# Patient Record
Sex: Female | Born: 1960 | Race: Black or African American | Hispanic: No | Marital: Single | Smoking: Current every day smoker
Health system: Southern US, Community
[De-identification: ages and names within clinical notes are randomized; demographics above are authoritative.]

## PROBLEM LIST (undated history)

## (undated) DIAGNOSIS — K589 Irritable bowel syndrome without diarrhea: Secondary | ICD-10-CM

## (undated) DIAGNOSIS — I10 Essential (primary) hypertension: Secondary | ICD-10-CM

---

## 2003-10-15 ENCOUNTER — Emergency Department (HOSPITAL_COMMUNITY): Admission: EM | Admit: 2003-10-15 | Discharge: 2003-10-15 | Payer: Self-pay | Admitting: Emergency Medicine

## 2004-03-07 ENCOUNTER — Emergency Department (HOSPITAL_COMMUNITY): Admission: EM | Admit: 2004-03-07 | Discharge: 2004-03-08 | Payer: Self-pay | Admitting: Emergency Medicine

## 2004-03-09 ENCOUNTER — Emergency Department (HOSPITAL_COMMUNITY): Admission: EM | Admit: 2004-03-09 | Discharge: 2004-03-10 | Payer: Self-pay | Admitting: Emergency Medicine

## 2015-05-17 ENCOUNTER — Emergency Department (HOSPITAL_COMMUNITY)
Admission: EM | Admit: 2015-05-17 | Discharge: 2015-05-17 | Disposition: A | Discharge status: Home / Self Care | Payer: BLUE CROSS/BLUE SHIELD | Attending: Emergency Medicine | Admitting: Emergency Medicine

## 2015-05-17 DIAGNOSIS — M7711 Lateral epicondylitis, right elbow: Secondary | ICD-10-CM | POA: Diagnosis not present

## 2015-05-17 DIAGNOSIS — M79631 Pain in right forearm: Secondary | ICD-10-CM | POA: Diagnosis present

## 2015-05-17 MED ORDER — NAPROXEN 500 MG PO TABS: 500.0000 mg | ORAL_TABLET | Freq: Two times a day (BID) | ORAL | Status: DC

## 2015-05-17 MED ORDER — PREDNISONE 50 MG PO TABS: ORAL_TABLET | ORAL | Status: AC

## 2015-05-17 MED ORDER — OXYCODONE-ACETAMINOPHEN 5-325 MG PO TABS
1.0000 | ORAL_TABLET | Freq: Once | ORAL | Status: AC
  Administered 2015-05-17: 1 via ORAL
  Filled 2015-05-17: qty 1

## 2015-05-17 MED ORDER — TRAMADOL HCL 50 MG PO TABS: 50.0000 mg | ORAL_TABLET | Freq: Four times a day (QID) | ORAL | Status: AC | PRN

## 2015-05-17 NOTE — ED Notes (Signed)
Pt reports right arm pain for the last 2 weeks. Denies recent injury. States unable to see PCP for a few weeks. Has been taking neurontin with no improvement. Pt with full ROM of affected extremity. Distal circulation intact. PT NAD and ambulatory.

## 2015-05-17 NOTE — Discharge Instructions (Signed)
Ice pack to elbow. Prescriptions for prednisone and pain medicine. Follow-up with orthopedics. Phone number given.

## 2015-05-17 NOTE — ED Provider Notes (Addendum)
CSN: [id number]     Arrival date & time 05/17/15  1133 History  By signing my name below, I, Jarvis Morgan, attest that this documentation has been prepared under the direction and in the presence of Donnetta Hutching, MD. Electronically Signed: Jarvis Morgan, ED Scribe. 05/17/2015. 1:42 PM.   Chief Complaint  Patient presents with  . Arm Pain   The history is provided by the patient. No language interpreter was used.    HPI Comments: Khloie Hamada is a 54 y.o. female with h/o bursitis who presents to the Emergency Department complaining of sudden onset, moderate, aching, right forearm pain that radiates into her right elbow for 2 weeks. She reports associated intermittent swelling. She denies any known injury to the area. Pt endorses the pain is exacerbated with raising her right forearm. Pt reports no pain with bending of right elbow. She states the only meds she has been taking is Neurontin, for her bursitis, with no significant improvement. Pt's PCP is Harrison Mons, NP at [facility] in [place] denies any color change, numbness or weakness to the arm  No past medical history on file. No past surgical history on file. No family history on file. Social History  Substance Use Topics  . Smoking status: Not on file  . Smokeless tobacco: Not on file  . Alcohol Use: Not on file   OB History    No data available     Review of Systems 10 Systems reviewed and all are negative for acute change except as noted in the HPI.     Allergies  Review of patient's allergies indicates not on file.  Home Medications   Prior to Admission medications   Medication Sig Start Date End Date Taking? Authorizing Provider  naproxen (NAPROSYN) 500 MG tablet Take 1 tablet (500 mg total) by mouth 2 (two) times daily. 05/17/15   Donnetta Hutching, MD  predniSONE (DELTASONE) 50 MG tablet One tablet for 7 days 05/17/15   Donnetta Hutching, MD   Triage Vitals: BP 127/96 mmHg  Pulse 63  Temp(Src) 98.4  F (36.9 C) (Oral)  Resp 16  SpO2 99%  Physical Exam  Constitutional: She is oriented to person, place, and time. She appears well-developed and well-nourished.  HENT:  Head: Normocephalic and atraumatic.  Eyes: Conjunctivae and EOM are normal. Pupils are equal, round, and reactive to light.  Neck: Normal range of motion. Neck supple.  Musculoskeletal: Normal range of motion.       Left elbow: Tenderness found. Lateral epicondyle tenderness noted.  Tender over lateral epicondyle area of the elbow Pain with ROM Tender along tendon and muscle belly  Neurological: She is alert and oriented to person, place, and time.  Skin: Skin is warm and dry.  Psychiatric: She has a normal mood and affect. Her behavior is normal.  Nursing note and vitals reviewed.   ED Course  Procedures (including critical care time)  DIAGNOSTIC STUDIES: Oxygen Saturation is 99% on RA, normal by my interpretation.    COORDINATION OF CARE: 1:14 PM- Will give pt rx for prednisone and short course of Percocet. Pt advised of plan for treatment and pt agrees.   Labs Review Labs Reviewed - No data to display  Imaging Review No results found. I have personally reviewed and evaluated these images and lab results as part of my medical decision-making.   EKG Interpretation None      MDM   Final diagnoses:  Epicondylitis, lateral, right   History and physical consistent  with lateral epicondylitis.  Rx ultram and prednisone. Referral to hand surgeon.  I, Leiya Keesey, personally performed the services described in this documentation. All medical record entries made by the scribe were at my direction and in my presence.  I have reviewed the chart and discharge instructions and agree that the record reflects my personal performance and is accurate and complete. Bernhardt Riemenschneider.  05/17/2015. 1:42 PM.       Donnetta Hutching, MD 05/17/15 1343  Donnetta Hutching, MD 05/17/15 1349

## 2015-08-02 ENCOUNTER — Emergency Department (HOSPITAL_COMMUNITY): Payer: BLUE CROSS/BLUE SHIELD

## 2015-08-02 ENCOUNTER — Encounter (HOSPITAL_COMMUNITY): Payer: Self-pay | Admitting: Emergency Medicine

## 2015-08-02 ENCOUNTER — Emergency Department (HOSPITAL_COMMUNITY)
Admission: EM | Admit: 2015-08-02 | Discharge: 2015-08-02 | Disposition: A | Discharge status: Home / Self Care | Payer: BLUE CROSS/BLUE SHIELD | Attending: Emergency Medicine | Admitting: Emergency Medicine

## 2015-08-02 DIAGNOSIS — I10 Essential (primary) hypertension: Secondary | ICD-10-CM | POA: Diagnosis not present

## 2015-08-02 DIAGNOSIS — S0003XA Contusion of scalp, initial encounter: Secondary | ICD-10-CM

## 2015-08-02 DIAGNOSIS — Y92410 Unspecified street and highway as the place of occurrence of the external cause: Secondary | ICD-10-CM | POA: Insufficient documentation

## 2015-08-02 DIAGNOSIS — Y9389 Activity, other specified: Secondary | ICD-10-CM | POA: Diagnosis not present

## 2015-08-02 DIAGNOSIS — S0083XA Contusion of other part of head, initial encounter: Secondary | ICD-10-CM | POA: Insufficient documentation

## 2015-08-02 DIAGNOSIS — F17200 Nicotine dependence, unspecified, uncomplicated: Secondary | ICD-10-CM | POA: Diagnosis not present

## 2015-08-02 DIAGNOSIS — Y998 Other external cause status: Secondary | ICD-10-CM | POA: Insufficient documentation

## 2015-08-02 DIAGNOSIS — M25512 Pain in left shoulder: Secondary | ICD-10-CM

## 2015-08-02 DIAGNOSIS — Z8719 Personal history of other diseases of the digestive system: Secondary | ICD-10-CM | POA: Insufficient documentation

## 2015-08-02 DIAGNOSIS — S4992XA Unspecified injury of left shoulder and upper arm, initial encounter: Secondary | ICD-10-CM | POA: Diagnosis not present

## 2015-08-02 DIAGNOSIS — S199XXA Unspecified injury of neck, initial encounter: Secondary | ICD-10-CM | POA: Diagnosis present

## 2015-08-02 DIAGNOSIS — M542 Cervicalgia: Secondary | ICD-10-CM

## 2015-08-02 HISTORY — DX: Essential (primary) hypertension: I10

## 2015-08-02 HISTORY — DX: Irritable bowel syndrome without diarrhea: K58.9

## 2015-08-02 MED ORDER — ONDANSETRON 4 MG PO TBDP
4.0000 mg | ORAL_TABLET | Freq: Once | ORAL | Status: AC
  Administered 2015-08-02: 4 mg via ORAL
  Filled 2015-08-02: qty 1

## 2015-08-02 MED ORDER — HYDROMORPHONE HCL 1 MG/ML IJ SOLN
1.0000 mg | Freq: Once | INTRAMUSCULAR | Status: AC
  Administered 2015-08-02: 1 mg via INTRAMUSCULAR
  Filled 2015-08-02: qty 1

## 2015-08-02 MED ORDER — OXYCODONE-ACETAMINOPHEN 5-325 MG PO TABS: 1.0000 | ORAL_TABLET | ORAL | Status: AC | PRN

## 2015-08-02 MED ORDER — METHOCARBAMOL 500 MG PO TABS: 500.0000 mg | ORAL_TABLET | Freq: Two times a day (BID) | ORAL | Status: AC

## 2015-08-02 MED ORDER — MORPHINE SULFATE (PF) 4 MG/ML IV SOLN: 4.0000 mg | Freq: Once | INTRAVENOUS | Status: DC
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
## 2015-08-02 NOTE — ED Notes (Signed)
Patient transported to CT 

## 2015-08-02 NOTE — ED Notes (Signed)
Vital signs stable. 
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
## 2015-08-02 NOTE — ED Notes (Signed)
Patient is alert and orientedx4.  Patient was explained discharge instructions and they understood them with no questions.   

## 2015-08-02 NOTE — Discharge Instructions (Signed)
Your imaging was negative for any acute injuries today. Take the prescribed medication as directed. Use caution when taking this medication, it can make you sleepy/drowsy. Follow-up with your primary care physician. Return to the ED for new or worsening symptoms.
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
